# Patient Record
Sex: Female | Born: 1950 | Race: Asian | Hispanic: No | Smoking: Current every day smoker
Health system: Southern US, Community
[De-identification: ages and names within clinical notes are randomized; demographics above are authoritative.]

---

## 2019-11-03 ENCOUNTER — Encounter: Payer: Self-pay | Admitting: Obstetrics and Gynecology

## 2019-11-03 ENCOUNTER — Ambulatory Visit (INDEPENDENT_AMBULATORY_CARE_PROVIDER_SITE_OTHER): Payer: Medicare Other | Admitting: Obstetrics and Gynecology

## 2019-11-03 ENCOUNTER — Other Ambulatory Visit: Payer: Self-pay

## 2019-11-03 VITALS — BP 116/70 | Ht 62.5 in | Wt 115.0 lb

## 2019-11-03 DIAGNOSIS — Z01419 Encounter for gynecological examination (general) (routine) without abnormal findings: Secondary | ICD-10-CM

## 2019-11-03 DIAGNOSIS — Z13820 Encounter for screening for osteoporosis: Secondary | ICD-10-CM

## 2019-11-03 NOTE — Progress Notes (Signed)
   Sarah Brennan November 01, 1950 [id number]  SUBJECTIVE:  69 y.o. G1P1 female here for a breast and pelvic exam and Pap smear. She has no gynecologic concerns.  Current Outpatient Medications  Medication Sig Dispense Refill  . b complex vitamins capsule Take 1 capsule by mouth daily.    Marland Kitchen VITAMIN D PO Take by mouth.     No current facility-administered medications for this visit.   Allergies: Patient has no known allergies.  No LMP recorded. Patient is postmenopausal.  Past medical history,surgical history, problem list, medications, allergies, family history and social history were all reviewed and documented as reviewed in the EPIC chart.  GYN ROS: no abnormal bleeding, pelvic pain or discharge, no breast pain or new or enlarging lumps on self exam.  No dysuria, urinary frequency, pain with urination, cloudy/malodorous urine.   OBJECTIVE:  BP 116/70   Ht 5' 2.5" (1.588 m)   Wt 115 lb (52.2 kg)   BMI 20.70 kg/m  The patient appears well, alert, oriented, in no distress.  BREAST EXAM: breasts appear normal, no suspicious masses, no skin or nipple changes or axillary nodes  PELVIC EXAM: VULVA: normal appearing vulva with atrophic change, no masses, tenderness or lesions, VAGINA: normal appearing vagina with atrophic change, normal color and discharge, no lesions, CERVIX: normal appearing atrophic cervix without discharge or lesions, UTERUS: uterus is normal size, shape, consistency and nontender, ADNEXA: normal adnexa in size, nontender and no masses  Chaperone: Caryn Bee present during the examination  ASSESSMENT:  69 y.o. G1P1 here for a breast and pelvic exam  PLAN:   1. Postmenopausal.  No significant hot flashes or night sweats.  No vaginal bleeding. 2. Pap smear 2 years ago.  Reports no history of abnormal Pap smears.  We discussed that with a normal Pap smear history that surveillance can stop at age 21 following the current guidelines and we explained rationale.  She is  comfortable not continuing screening. 3. Mammogram 2 years ago.  Normal breast exam today.  I encouraged her to call to schedule an annual mammogram.  Imaging center contact information will be provided upon request at checkout window. 4. Colonoscopy at least 5 years ago.  I encouraged her to check into when she had a last done and see what her recommended screening interval was.  We will provide her with phone numbers to contact to set up colonoscopy screening at one of the local GI practices in town. 5. DEXA never.  He is taking vitamin D but not calcium.  We discussed importance of calcium and maintaining bone health.  She says her primary provider checked her vitamin D level and recommended that she go on 5000 IU/day.  I recommend that she schedule a DEXA at checkout today. 6. Health maintenance.  No labs today as she normally has these completed with her primary care provider.   Return annually or sooner, prn.  Joseph Pierini MD 11/03/19

## 2019-11-04 ENCOUNTER — Telehealth: Payer: Self-pay | Admitting: *Deleted

## 2019-11-04 NOTE — Telephone Encounter (Signed)
-----   Message from Joseph Pierini, MD sent at 11/03/2019  3:02 PM EDT ----- Regarding: mammogram & [place] Pt is new to area and says typically in last 3 MMGs she has had to go back to get breast ultrasound because of dense breasts, wondering if she can just do ultrasound at time of MMG this year if possible. Or is that something she should ask the imaging center?

## 2019-11-04 NOTE — Telephone Encounter (Signed)
I called and informed her that I don't think the below would be the breast center protocol. But she could call them and check. Patient said that is fine if it not she will call and schedule mammogram screening. No breast problems.

## 2019-11-08 ENCOUNTER — Other Ambulatory Visit: Payer: Self-pay | Admitting: Family Medicine

## 2019-11-08 ENCOUNTER — Other Ambulatory Visit: Payer: Self-pay | Admitting: Obstetrics and Gynecology

## 2019-11-08 DIAGNOSIS — Z1231 Encounter for screening mammogram for malignant neoplasm of breast: Secondary | ICD-10-CM

## 2019-11-15 ENCOUNTER — Ambulatory Visit
Admission: RE | Admit: 2019-11-15 | Discharge: 2019-11-15 | Disposition: A | Discharge status: Home / Self Care | Payer: Medicare Other | Source: Ambulatory Visit | Attending: Obstetrics and Gynecology | Admitting: Obstetrics and Gynecology

## 2019-11-15 ENCOUNTER — Other Ambulatory Visit: Payer: Self-pay

## 2019-11-15 DIAGNOSIS — Z1231 Encounter for screening mammogram for malignant neoplasm of breast: Secondary | ICD-10-CM

## 2020-01-03 ENCOUNTER — Other Ambulatory Visit: Payer: Self-pay

## 2020-01-03 ENCOUNTER — Ambulatory Visit (INDEPENDENT_AMBULATORY_CARE_PROVIDER_SITE_OTHER): Payer: Medicare Other

## 2020-01-03 ENCOUNTER — Other Ambulatory Visit: Payer: Self-pay | Admitting: Obstetrics and Gynecology

## 2020-01-03 DIAGNOSIS — Z13820 Encounter for screening for osteoporosis: Secondary | ICD-10-CM

## 2020-01-03 DIAGNOSIS — Z780 Asymptomatic menopausal state: Secondary | ICD-10-CM

## 2020-01-03 DIAGNOSIS — Z01419 Encounter for gynecological examination (general) (routine) without abnormal findings: Secondary | ICD-10-CM

## 2020-03-30 ENCOUNTER — Other Ambulatory Visit: Payer: Self-pay | Admitting: Family Medicine

## 2020-03-30 DIAGNOSIS — E041 Nontoxic single thyroid nodule: Secondary | ICD-10-CM

## 2020-04-16 ENCOUNTER — Ambulatory Visit
Admission: RE | Admit: 2020-04-16 | Discharge: 2020-04-16 | Disposition: A | Discharge status: Home / Self Care | Payer: Medicare Other | Source: Ambulatory Visit | Attending: Family Medicine | Admitting: Family Medicine

## 2020-04-16 DIAGNOSIS — E041 Nontoxic single thyroid nodule: Secondary | ICD-10-CM

## 2020-04-25 ENCOUNTER — Other Ambulatory Visit: Payer: Self-pay | Admitting: Family Medicine

## 2020-04-25 DIAGNOSIS — E041 Nontoxic single thyroid nodule: Secondary | ICD-10-CM

## 2020-05-10 ENCOUNTER — Ambulatory Visit
Admission: RE | Admit: 2020-05-10 | Discharge: 2020-05-10 | Disposition: A | Discharge status: Home / Self Care | Payer: Medicare Other | Source: Ambulatory Visit | Attending: Family Medicine | Admitting: Family Medicine

## 2020-05-10 ENCOUNTER — Other Ambulatory Visit (HOSPITAL_COMMUNITY)
Admission: RE | Admit: 2020-05-10 | Discharge: 2020-05-10 | Disposition: A | Discharge status: Home / Self Care | Payer: Medicare Other | Source: Ambulatory Visit | Attending: Family Medicine | Admitting: Family Medicine

## 2020-05-10 DIAGNOSIS — D34 Benign neoplasm of thyroid gland: Secondary | ICD-10-CM | POA: Insufficient documentation

## 2020-05-10 DIAGNOSIS — E041 Nontoxic single thyroid nodule: Secondary | ICD-10-CM

## 2020-05-11 LAB — CYTOLOGY - NON PAP

## 2021-04-09 ENCOUNTER — Other Ambulatory Visit: Payer: Self-pay | Admitting: Obstetrics & Gynecology

## 2021-04-09 DIAGNOSIS — Z1231 Encounter for screening mammogram for malignant neoplasm of breast: Secondary | ICD-10-CM

## 2021-04-10 ENCOUNTER — Ambulatory Visit (INDEPENDENT_AMBULATORY_CARE_PROVIDER_SITE_OTHER): Payer: Medicare Other | Admitting: Obstetrics & Gynecology

## 2021-04-10 ENCOUNTER — Encounter: Payer: Self-pay | Admitting: Obstetrics & Gynecology

## 2021-04-10 ENCOUNTER — Other Ambulatory Visit (HOSPITAL_COMMUNITY)
Admission: RE | Admit: 2021-04-10 | Discharge: 2021-04-10 | Disposition: A | Discharge status: Home / Self Care | Payer: Medicare Other | Source: Ambulatory Visit | Attending: Obstetrics & Gynecology | Admitting: Obstetrics & Gynecology

## 2021-04-10 ENCOUNTER — Other Ambulatory Visit: Payer: Self-pay

## 2021-04-10 VITALS — BP 120/76 | HR 72 | Resp 16 | Ht 62.0 in | Wt 114.0 lb

## 2021-04-10 DIAGNOSIS — Z780 Asymptomatic menopausal state: Secondary | ICD-10-CM

## 2021-04-10 DIAGNOSIS — Z124 Encounter for screening for malignant neoplasm of cervix: Secondary | ICD-10-CM | POA: Diagnosis not present

## 2021-04-10 DIAGNOSIS — Z01419 Encounter for gynecological examination (general) (routine) without abnormal findings: Secondary | ICD-10-CM

## 2021-04-10 NOTE — Progress Notes (Signed)
? ? ?Haide Klinker 1950-02-05 [id number] ? ? ?History:    71 y.o. G1P1L1 Divorced ? ?RP:  Established patient presenting for annual gyn exam  ? ?HPI: Postmenopausal, well on no HRT.  No significant hot flashes or night sweats.  No vaginal bleeding.  No pelvic pain.  Not currently sexually active.  Pap smear Neg 4 years ago.Reports no history of abnormal Pap smears.  Pap reflex done today. Breasts normal.  Mammogram 11/2019 Neg, repeat Mammo scheduled this month.  Colonoscopy 05/2020. Bone density Normal 12/2019. BMI 20.85. Tai chi and Yoga.  Health labs with Fam MD. ? ? ?Past medical history,surgical history, family history and social history were all reviewed and documented in the EPIC chart. ? ?Gynecologic History ?No LMP recorded. Patient is postmenopausal. ? ?Obstetric History ?OB History  ?Gravida Para Term Preterm AB Living  ?_0 ?SAB IAB Ectopic Multiple Live Births  ?           ?  ?# Outcome Date GA Lbr Len/2nd Weight Sex Delivery Anes PTL Lv  ?1 Para           ? ? ? ?ROS: A ROS was performed and pertinent positives and negatives are included in the history. ? GENERAL: No fevers or chills. HEENT: No change in vision, no earache, sore throat or sinus congestion. NECK: No pain or stiffness. CARDIOVASCULAR: No chest pain or pressure. No palpitations. PULMONARY: No shortness of breath, cough or wheeze. GASTROINTESTINAL: No abdominal pain, nausea, vomiting or diarrhea, melena or bright red blood per rectum. GENITOURINARY: No urinary frequency, urgency, hesitancy or dysuria. MUSCULOSKELETAL: No joint or muscle pain, no back pain, no recent trauma. DERMATOLOGIC: No rash, no itching, no lesions. ENDOCRINE: No polyuria, polydipsia, no heat or cold intolerance. No recent change in weight. HEMATOLOGICAL: No anemia or easy bruising or bleeding. NEUROLOGIC: No headache, seizures, numbness, tingling or weakness. PSYCHIATRIC: No depression, no loss of interest in normal activity or change in sleep pattern.  ?   ? ?Exam: ? ? ?BP 120/76   Pulse 72   Resp 16   Ht _1  (1.575 m)   Wt 114 lb (51.7 kg)   BMI 20.85 kg/m?  ? ?Body mass index is 20.85 kg/m?. ? ?General appearance : Well developed well nourished female. No acute distress ?HEENT: Eyes: no retinal hemorrhage or exudates,  Neck supple, trachea midline, no carotid bruits, no thyroidmegaly ?Lungs: Clear to auscultation, no rhonchi or wheezes, or rib retractions  ?Heart: Regular rate and rhythm, no murmurs or gallops ?Breast:Examined in sitting and supine position were symmetrical in appearance, no palpable masses or tenderness,  no skin retraction, no nipple inversion, no nipple discharge, no skin discoloration, no axillary or supraclavicular lymphadenopathy ?Abdomen: no palpable masses or tenderness, no rebound or guarding ?Extremities: no edema or skin discoloration or tenderness ? ?Pelvic: Vulva: Normal ?            Vagina: No gross lesions or discharge ? Cervix: No gross lesions or discharge.  Pap reflex done. ? Uterus  AV, normal size, shape and consistency, non-tender and mobile ? Adnexa  Without masses or tenderness ? Anus: Normal ? ? ?Assessment/Plan:  71 y.o. female for annual exam  ? ?1. Encounter for routine gynecological examination with Papanicolaou smear of cervix ?Postmenopausal, well on no HRT.  No significant hot flashes or night sweats.  No vaginal bleeding.  No pelvic pain.  Not currently sexually active.  Pap smear Neg 4 years  ago.Reports no history of abnormal Pap smears.  Pap reflex done today. Breasts normal.  Mammogram 11/2019 Neg, repeat Mammo scheduled this month.  Colonoscopy 05/2020. Bone density Normal 12/2019. BMI 20.85. Tai chi and Yoga.  Health labs with Fam MD. ?- Cytology - PAP( [place]) ? ?2. Postmenopause  ?Postmenopausal, well on no HRT.  No significant hot flashes or night sweats.  No vaginal bleeding.  No pelvic pain.  Not currently sexually active. Bone density Normal 12/2019.  ? ?Princess Bruins MD, 10:40 AM  04/10/2021 ? ?  ?

## 2021-04-15 LAB — CYTOLOGY - PAP: Diagnosis: NEGATIVE

## 2021-04-22 ENCOUNTER — Ambulatory Visit: Payer: Medicare Other

## 2021-04-30 ENCOUNTER — Ambulatory Visit
Admission: RE | Admit: 2021-04-30 | Discharge: 2021-04-30 | Disposition: A | Discharge status: Home / Self Care | Payer: Medicare Other | Source: Ambulatory Visit | Attending: Obstetrics & Gynecology | Admitting: Obstetrics & Gynecology

## 2021-04-30 DIAGNOSIS — Z1231 Encounter for screening mammogram for malignant neoplasm of breast: Secondary | ICD-10-CM

## 2021-07-29 IMAGING — MG DIGITAL SCREENING BILAT W/ TOMO W/ CAD
8 series · 9 of 24 positions shown · non-contrast
Comparison: Previous exam(s).

CLINICAL DATA: Screening.

EXAM:
DIGITAL SCREENING BILATERAL MAMMOGRAM WITH TOMO AND CAD

[L MLO synth-2D]
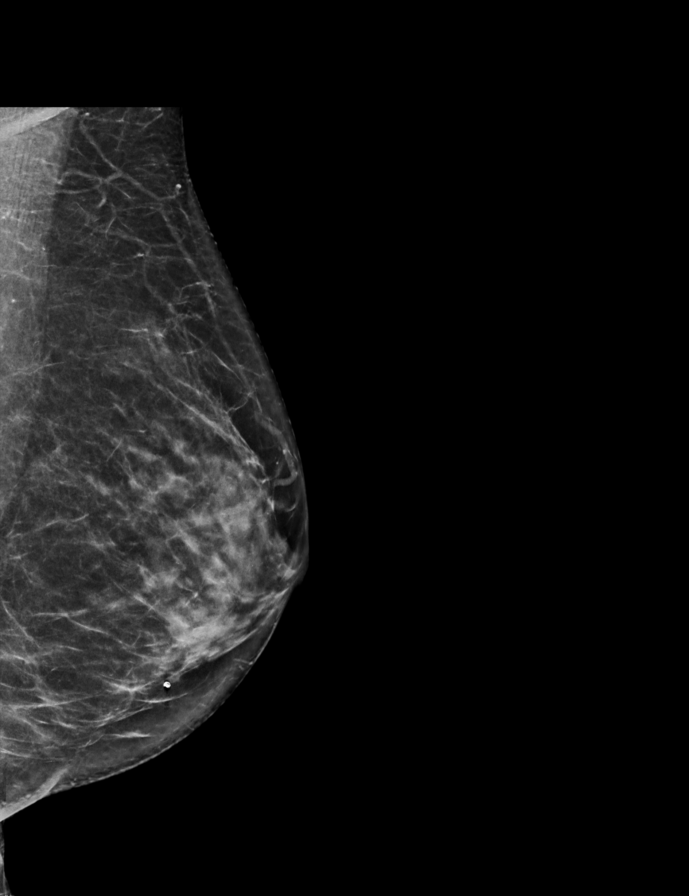

[L CC synth-2D]
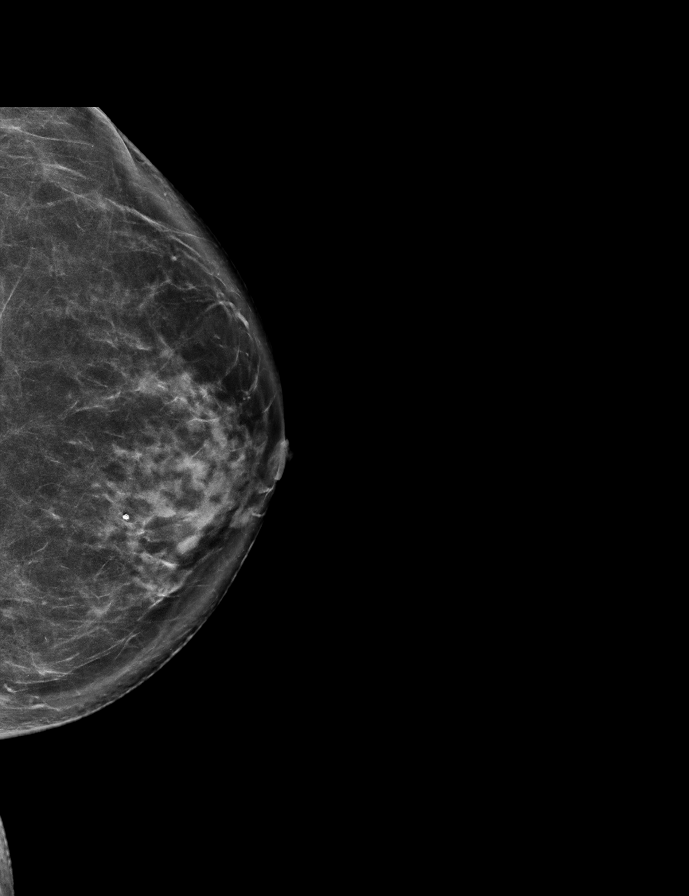

[R CC synth-2D]
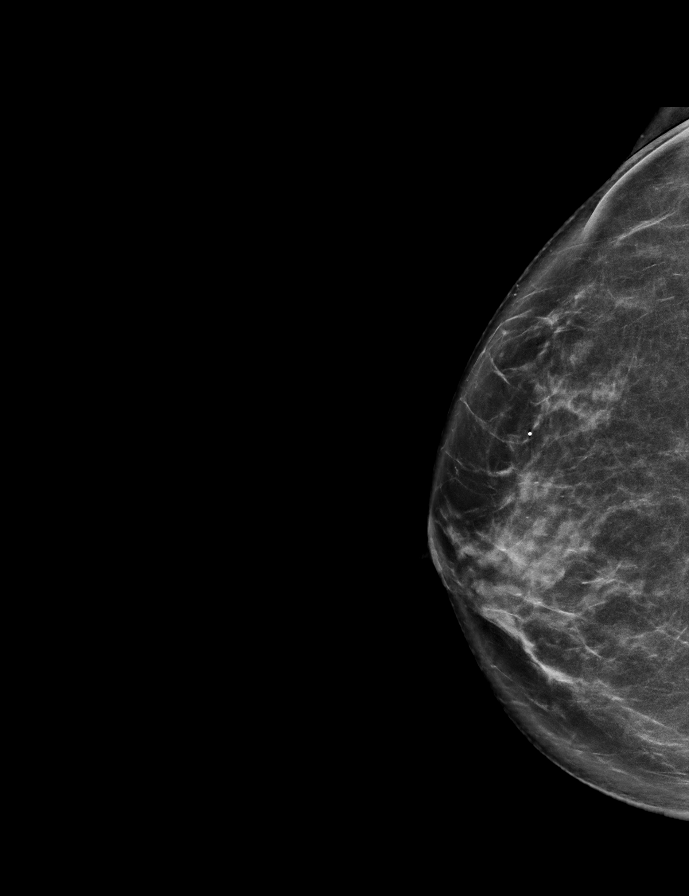

[R MLO synth-2D]
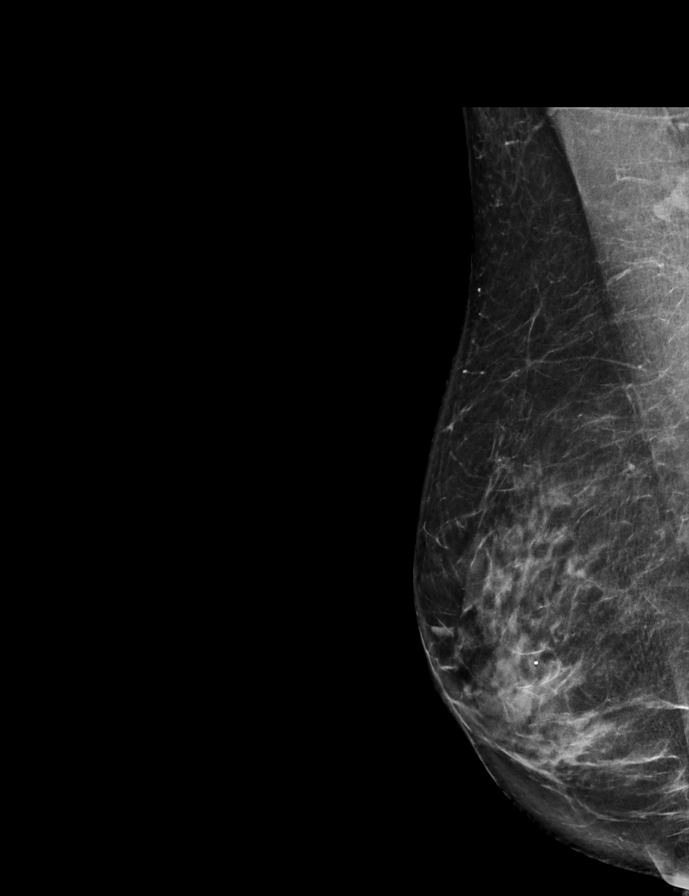

[R CC tomo · 2 of 73 frames shown]
[frame 24/73]
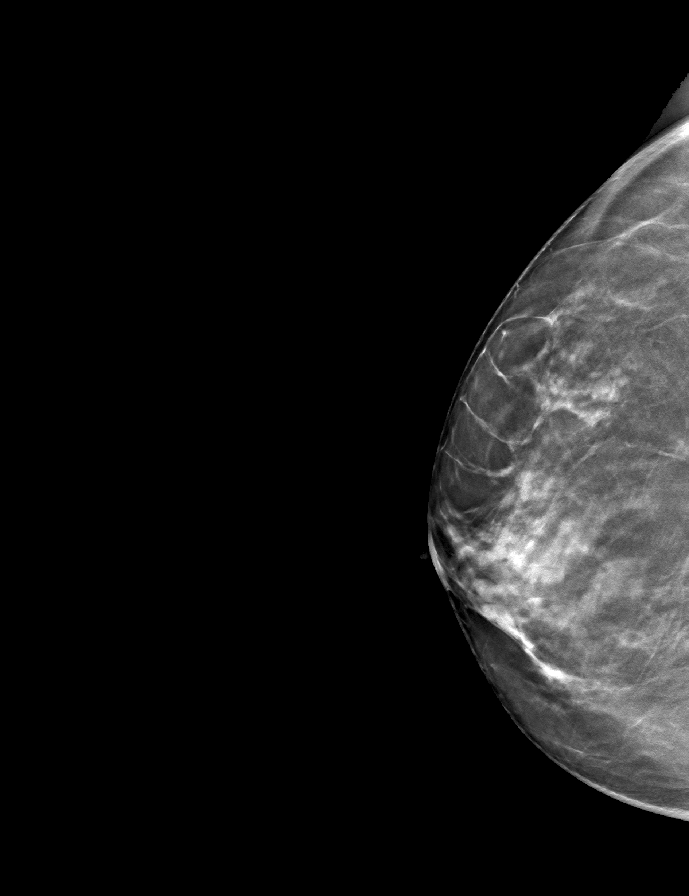
[frame 37/73]
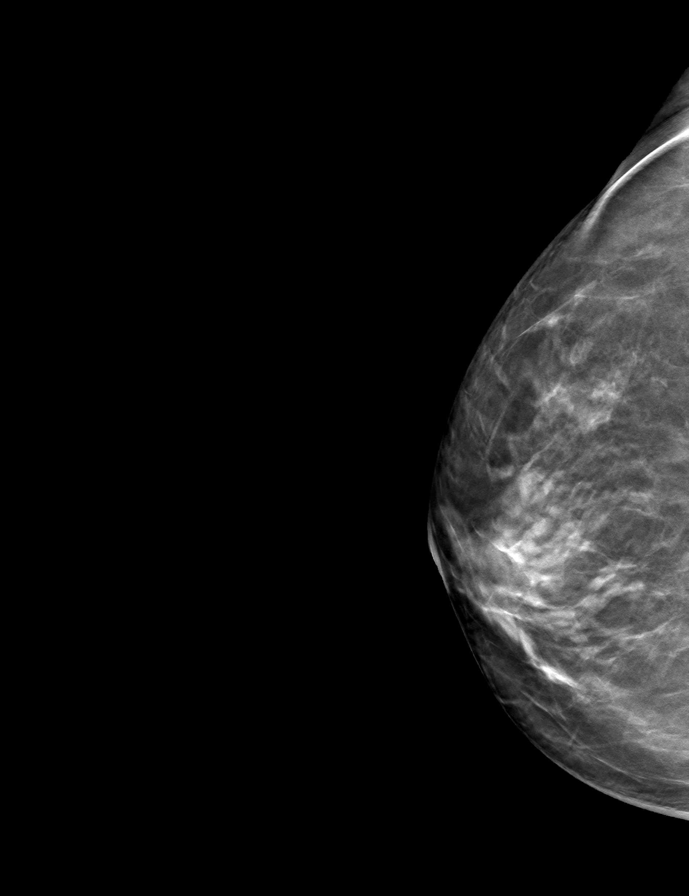

[L CC tomo · tomo slice 37/73.0]
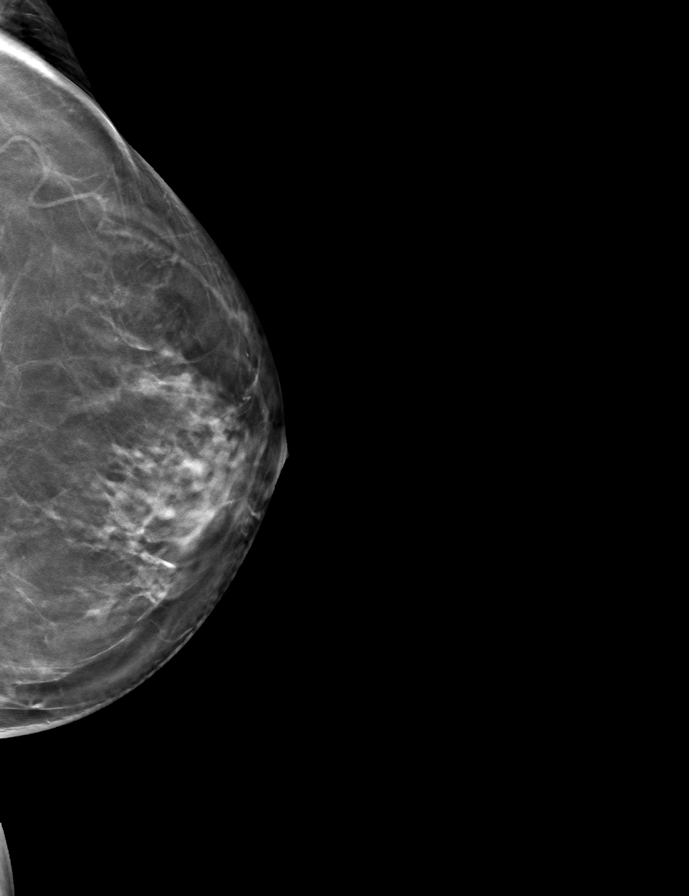

[L MLO tomo · tomo slice 33/66.0]
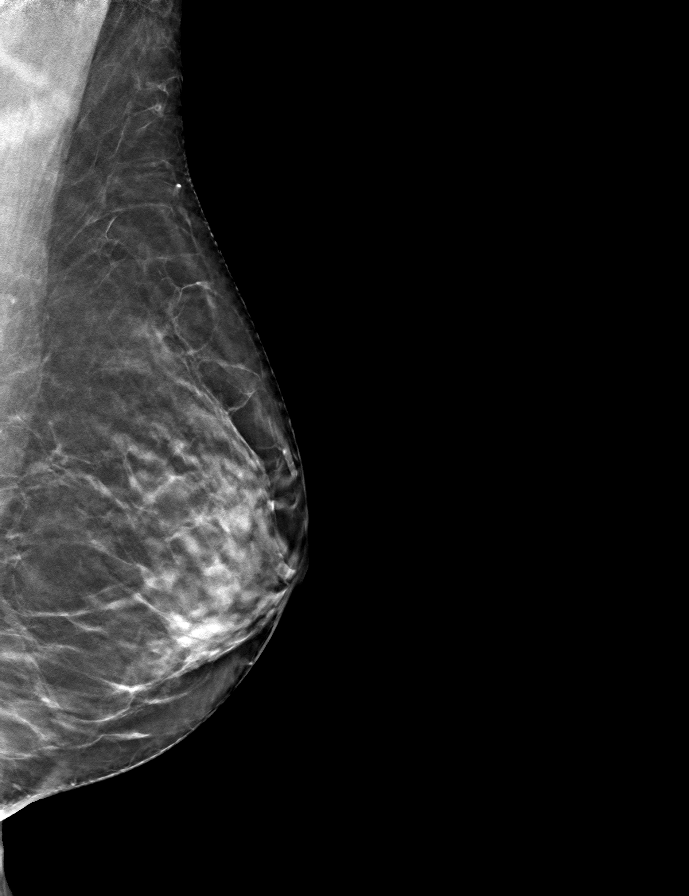

[R MLO tomo · tomo slice 38/75.0]
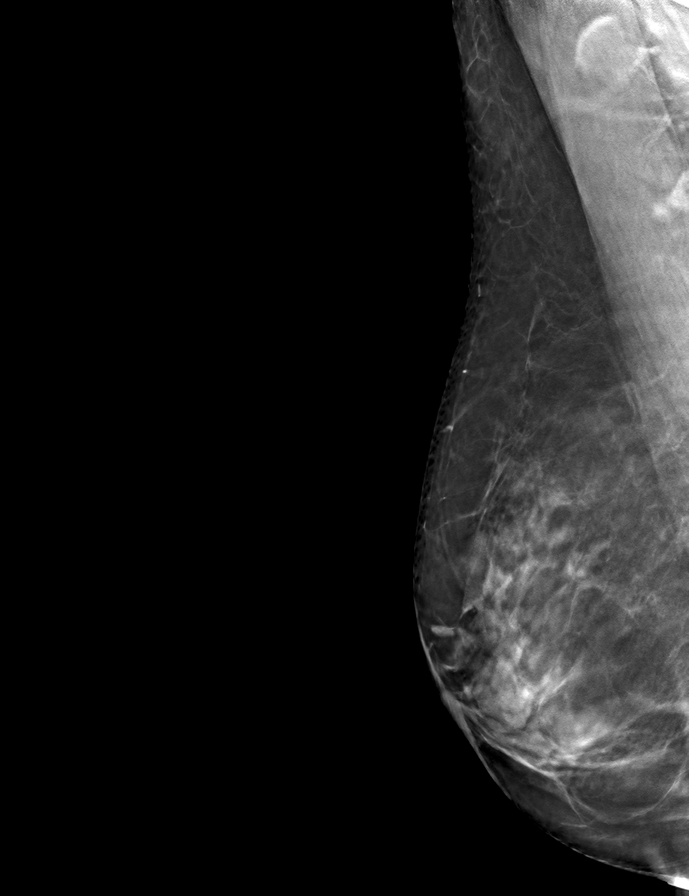

[9 of 24 positions shown; findings below may reference images not displayed]

ACR Breast Density Category c: The breast tissue is heterogeneously
dense, which may obscure small masses.
FINDINGS: There are no findings suspicious for malignancy. Images were
processed with CAD.
IMPRESSION: No mammographic evidence of malignancy. A result letter of this
screening mammogram will be mailed directly to the patient.

RECOMMENDATION:
Screening mammogram in one year. (Code:FT-U-LHB)

BI-RADS CATEGORY  1: Negative.

## 2022-08-20 ENCOUNTER — Other Ambulatory Visit: Payer: Self-pay | Admitting: Obstetrics & Gynecology

## 2022-08-20 DIAGNOSIS — Z1231 Encounter for screening mammogram for malignant neoplasm of breast: Secondary | ICD-10-CM

## 2022-09-11 ENCOUNTER — Ambulatory Visit: Payer: Medicare Other | Admitting: Obstetrics & Gynecology

## 2022-09-16 ENCOUNTER — Ambulatory Visit: Admission: RE | Admit: 2022-09-16 | Payer: Medicare Other | Source: Ambulatory Visit

## 2022-09-16 DIAGNOSIS — Z1231 Encounter for screening mammogram for malignant neoplasm of breast: Secondary | ICD-10-CM

## 2022-09-18 ENCOUNTER — Other Ambulatory Visit: Payer: Self-pay | Admitting: Obstetrics & Gynecology

## 2022-09-18 DIAGNOSIS — R928 Other abnormal and inconclusive findings on diagnostic imaging of breast: Secondary | ICD-10-CM

## 2022-09-19 ENCOUNTER — Other Ambulatory Visit: Payer: Self-pay | Admitting: Family Medicine

## 2022-09-19 ENCOUNTER — Encounter: Payer: Self-pay | Admitting: Family Medicine

## 2022-09-19 DIAGNOSIS — R928 Other abnormal and inconclusive findings on diagnostic imaging of breast: Secondary | ICD-10-CM

## 2022-09-26 ENCOUNTER — Encounter: Payer: Self-pay | Admitting: Family Medicine

## 2022-09-26 ENCOUNTER — Ambulatory Visit
Admission: RE | Admit: 2022-09-26 | Discharge: 2022-09-26 | Disposition: A | Discharge status: Home / Self Care | Payer: Medicare Other | Source: Ambulatory Visit | Attending: Family Medicine | Admitting: Family Medicine

## 2022-09-26 ENCOUNTER — Other Ambulatory Visit: Payer: Self-pay | Admitting: Family Medicine

## 2022-09-26 ENCOUNTER — Ambulatory Visit: Admission: RE | Admit: 2022-09-26 | Payer: Medicare Other | Source: Ambulatory Visit

## 2022-09-26 DIAGNOSIS — R928 Other abnormal and inconclusive findings on diagnostic imaging of breast: Secondary | ICD-10-CM

## 2022-09-26 DIAGNOSIS — N6320 Unspecified lump in the left breast, unspecified quadrant: Secondary | ICD-10-CM

## 2022-09-26 HISTORY — PX: BREAST BIOPSY: SHX20

## 2023-08-26 ENCOUNTER — Encounter: Payer: Medicare Other | Admitting: Radiology
